# Patient Record
Sex: Male | Born: 2005 | Race: Black or African American | Hispanic: No | Marital: Single | Smoking: Never smoker
Health system: Southern US, Community
[De-identification: ages and names within clinical notes are randomized; demographics above are authoritative.]

## PROBLEM LIST (undated history)

## (undated) DIAGNOSIS — J45909 Unspecified asthma, uncomplicated: Secondary | ICD-10-CM

---
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
## 2023-11-04 ENCOUNTER — Encounter: Payer: Self-pay | Admitting: *Deleted
